# Patient Record
Sex: Female | Born: 1975 | Race: Black or African American | Hispanic: No | Marital: Single | Smoking: Former smoker
Health system: Southern US, Community
[De-identification: ages and names within clinical notes are randomized; demographics above are authoritative.]

## PROBLEM LIST (undated history)

## (undated) DIAGNOSIS — I10 Essential (primary) hypertension: Secondary | ICD-10-CM

## (undated) HISTORY — PX: HERNIA REPAIR: SHX51

## (undated) HISTORY — PX: DEBRIDEMENT AND CLOSURE WOUND: SHX5614

---

## 2016-08-09 ENCOUNTER — Emergency Department (HOSPITAL_COMMUNITY)
Admission: EM | Admit: 2016-08-09 | Discharge: 2016-08-09 | Disposition: A | Discharge status: Home / Self Care | Payer: Medicaid Other | Attending: Emergency Medicine | Admitting: Emergency Medicine

## 2016-08-09 ENCOUNTER — Emergency Department (HOSPITAL_COMMUNITY): Payer: Medicaid Other

## 2016-08-09 ENCOUNTER — Encounter (HOSPITAL_COMMUNITY): Payer: Self-pay

## 2016-08-09 DIAGNOSIS — J0120 Acute ethmoidal sinusitis, unspecified: Secondary | ICD-10-CM

## 2016-08-09 DIAGNOSIS — Z87891 Personal history of nicotine dependence: Secondary | ICD-10-CM | POA: Insufficient documentation

## 2016-08-09 DIAGNOSIS — I10 Essential (primary) hypertension: Secondary | ICD-10-CM

## 2016-08-09 DIAGNOSIS — R55 Syncope and collapse: Secondary | ICD-10-CM | POA: Diagnosis not present

## 2016-08-09 DIAGNOSIS — Z760 Encounter for issue of repeat prescription: Secondary | ICD-10-CM | POA: Insufficient documentation

## 2016-08-09 DIAGNOSIS — R202 Paresthesia of skin: Secondary | ICD-10-CM | POA: Insufficient documentation

## 2016-08-09 HISTORY — DX: Essential (primary) hypertension: I10

## 2016-08-09 LAB — URINALYSIS, ROUTINE W REFLEX MICROSCOPIC
Bilirubin Urine: NEGATIVE
GLUCOSE, UA: NEGATIVE mg/dL
KETONES UR: NEGATIVE mg/dL
Leukocytes, UA: NEGATIVE
Nitrite: NEGATIVE
PROTEIN: 100 mg/dL — AB
Specific Gravity, Urine: 1.012 (ref 1.005–1.030)
pH: 6 (ref 5.0–8.0)

## 2016-08-09 LAB — COMPREHENSIVE METABOLIC PANEL
ALBUMIN: 3.5 g/dL (ref 3.5–5.0)
ALT: 14 U/L (ref 14–54)
AST: 21 U/L (ref 15–41)
Alkaline Phosphatase: 59 U/L (ref 38–126)
Anion gap: 8 (ref 5–15)
BUN: 9 mg/dL (ref 6–20)
CHLORIDE: 101 mmol/L (ref 101–111)
CO2: 29 mmol/L (ref 22–32)
Calcium: 9.1 mg/dL (ref 8.9–10.3)
Creatinine, Ser: 0.9 mg/dL (ref 0.44–1.00)
GFR calc Af Amer: >60 mL/min (ref >60)
GLUCOSE: 96 mg/dL (ref 65–99)
POTASSIUM: 3.4 mmol/L — AB (ref 3.5–5.1)
SODIUM: 138 mmol/L (ref 135–145)
Total Bilirubin: 0.4 mg/dL (ref 0.3–1.2)
Total Protein: 6.6 g/dL (ref 6.5–8.1)

## 2016-08-09 LAB — CBC WITH DIFFERENTIAL/PLATELET
BASOS ABS: 0 10*3/uL (ref 0.0–0.1)
BASOS PCT: 0 %
EOS ABS: 0.1 10*3/uL (ref 0.0–0.7)
EOS PCT: 2 %
HCT: 39 % (ref 36.0–46.0)
Hemoglobin: 13.1 g/dL (ref 12.0–15.0)
Lymphocytes Relative: 35 %
Lymphs Abs: 2.8 10*3/uL (ref 0.7–4.0)
MCH: 27 pg (ref 26.0–34.0)
MCHC: 33.6 g/dL (ref 30.0–36.0)
MCV: 80.2 fL (ref 78.0–100.0)
MONO ABS: 0.5 10*3/uL (ref 0.1–1.0)
Monocytes Relative: 6 %
Neutro Abs: 4.7 10*3/uL (ref 1.7–7.7)
Neutrophils Relative %: 57 %
PLATELETS: 346 10*3/uL (ref 150–400)
RBC: 4.86 MIL/uL (ref 3.87–5.11)
RDW: 13.7 % (ref 11.5–15.5)
WBC: 8.1 10*3/uL (ref 4.0–10.5)

## 2016-08-09 LAB — PREGNANCY, URINE: Preg Test, Ur: NEGATIVE

## 2016-08-09 MED ORDER — DICYCLOMINE HCL 20 MG PO TABS: 20.0000 mg | ORAL_TABLET | Freq: Two times a day (BID) | ORAL | 0 refills | Status: AC | PRN

## 2016-08-09 MED ORDER — FLUTICASONE PROPIONATE 50 MCG/ACT NA SUSP: 2.0000 | Freq: Every day | NASAL | 0 refills | Status: AC

## 2016-08-09 MED ORDER — LISINOPRIL 10 MG PO TABS
10.0000 mg | ORAL_TABLET | Freq: Every day | ORAL | 0 refills | Status: AC
Start: 2016-08-09 — End: ?

## 2016-08-09 MED ORDER — DOCUSATE SODIUM 100 MG PO CAPS: 100.0000 mg | ORAL_CAPSULE | Freq: Two times a day (BID) | ORAL | 0 refills | Status: AC

## 2016-08-09 MED ORDER — LORATADINE 10 MG PO TABS: 10.0000 mg | ORAL_TABLET | Freq: Every day | ORAL | 0 refills | Status: AC

## 2016-08-09 NOTE — ED Notes (Signed)
Pt ambulated back to room from RR with steady gait.

## 2016-08-09 NOTE — ED Notes (Signed)
Pt offered sandwich at DC and offered wheelchair out to waiting room.

## 2016-08-09 NOTE — ED Notes (Signed)
Pt returned to room and placed back on monitor.  

## 2016-08-09 NOTE — ED Provider Notes (Signed)
[facility]-EMERGENCY DEPT Provider Note   CSN: [id number] Arrival date & time: 08/09/16  1821     History   Chief Complaint Chief Complaint  Patient presents with  . Blurred Vision  . Medication Refill  . Hypertension    HPI Betty Fry is a 41 y.o. female.  HPI Patient states she ran out of her blood pressure medication 2 days ago. She's been having facial pain with nasal congestion for last few days. Also states she's had some blurred vision. She became lightheaded and felt that she was going to pass out earlier today. This prompted her 911 call. She complains of tingling sensation to the left upper and lower extremities. Denies chest pain or shortness of breath. No abdominal pain, nausea or vomiting. Past Medical History:  Diagnosis Date  . Hypertension     There are no active problems to display for this patient.   Past Surgical History:  Procedure Laterality Date  . CESAREAN SECTION    . DEBRIDEMENT AND CLOSURE WOUND    . HERNIA REPAIR     mid abdominal    OB History    No data available       Home Medications    Prior to Admission medications   Medication Sig Start Date End Date Taking? Authorizing Provider  fluticasone (FLONASE) 50 MCG/ACT nasal spray Place 2 sprays into both nostrils daily. 08/09/16   Loren Racer, MD  lisinopril (PRINIVIL,ZESTRIL) 10 MG tablet Take 1 tablet (10 mg total) by mouth daily. 08/09/16   Loren Racer, MD  loratadine (CLARITIN) 10 MG tablet Take 1 tablet (10 mg total) by mouth daily. 08/09/16   Loren Racer, MD    Family History No family history on file.  Social History Social History  Substance Use Topics  . Smoking status: Former Smoker    Types: Cigarettes  . Smokeless tobacco: Never Used     Comment: have not smoked in 4 days  . Alcohol use Yes     Comment: social     Allergies   Patient has no allergy information on record.   Review of Systems Review of Systems  Constitutional: Negative for chills and  fever.  HENT: Positive for congestion, sinus pain and sinus pressure. Negative for facial swelling.   Eyes: Positive for visual disturbance. Negative for pain.  Respiratory: Negative for cough and shortness of breath.   Cardiovascular: Negative for chest pain, palpitations and leg swelling.  Gastrointestinal: Negative for diarrhea, nausea and vomiting.  Genitourinary: Negative for dysuria, flank pain, frequency and hematuria.  Musculoskeletal: Negative for back pain and neck pain.  Skin: Negative for rash and wound.  Neurological: Positive for dizziness, light-headedness, numbness and headaches. Negative for syncope and weakness.  All other systems reviewed and are negative.    Physical Exam Updated Vital Signs BP (!) 147/90   Pulse 77   Temp 98.1 F (36.7 C) (Oral)   Resp 17   Ht 5\' 7"  (1.702 m)   Wt 233 lb (105.7 kg)   LMP 07/24/2016 (Exact Date)   SpO2 100%   BMI 36.49 kg/m   Physical Exam  Constitutional: She is oriented to person, place, and time. She appears well-developed and well-nourished.  HENT:  Head: Normocephalic and atraumatic.  Mouth/Throat: Oropharynx is clear and moist.  Left greater than right nasal mucosal edema. Patient has tenderness to percussion over the left frontal and maxillary sinuses.  Eyes: EOM are normal. Pupils are equal, round, and reactive to light.  Neck: Normal range of  motion. Neck supple.  No meningismus. No cervical lymphadenopathy.  Cardiovascular: Normal rate and regular rhythm.  Exam reveals no gallop and no friction rub.   No murmur heard. Pulmonary/Chest: Effort normal and breath sounds normal. No respiratory distress. She has no wheezes. She has no rales. She exhibits no tenderness.  Abdominal: Soft. Bowel sounds are normal. There is no tenderness. There is no rebound and no guarding.  Musculoskeletal: Normal range of motion. She exhibits no edema or tenderness.  No lower extremity swelling or asymmetry. 2+ distal pulses in all  extremities.  Neurological: She is alert and oriented to person, place, and time.  5/5 motor in all extremities. Mild decreased sensation to the left upper extremity compared to the right. No facial asymmetry.  Skin: Skin is warm and dry. Capillary refill takes less than 2 seconds. No rash noted. No erythema.  Psychiatric: She has a normal mood and affect. Her behavior is normal.  Nursing note and vitals reviewed.    ED Treatments / Results  Labs (all labs ordered are listed, but only abnormal results are displayed) Labs Reviewed  COMPREHENSIVE METABOLIC PANEL - Abnormal; Notable for the following:       Result Value   Potassium 3.4 (*)    All other components within normal limits  URINALYSIS, ROUTINE W REFLEX MICROSCOPIC - Abnormal; Notable for the following:    APPearance HAZY (*)    Hgb urine dipstick LARGE (*)    Protein, ur 100 (*)    Bacteria, UA RARE (*)    Squamous Epithelial / LPF 0-5 (*)    All other components within normal limits  CBC WITH DIFFERENTIAL/PLATELET  PREGNANCY, URINE    EKG  EKG Interpretation  Date/Time:  Thursday August 09 2016 18:55:32 EDT Ventricular Rate:  70 PR Interval:    QRS Duration: 92 QT Interval:  447 QTC Calculation: 483 R Axis:   -14 Text Interpretation:  Sinus rhythm Probable left atrial enlargement Probable anterior infarct, age indeterminate Abnormal T, consider ischemia, diffuse leads Lateral leads are also involved Confirmed by Ranae PalmsYELVERTON  MD, Yina Riviere ([id number]) on 08/09/2016 9:37:49 PM       Radiology Ct Head Wo Contrast  Result Date: 08/09/2016 CLINICAL DATA:  Headache and hypertension EXAM: CT HEAD WITHOUT CONTRAST TECHNIQUE: Contiguous axial images were obtained from the base of the skull through the vertex without intravenous contrast. COMPARISON:  None. FINDINGS: Brain: The ventricles are normal in size and configuration. There is a cavum septum pellucidum, an anatomic variant. There is no intracranial mass hemorrhage,  extra-axial fluid collection, or midline shift. Gray-white compartments appear normal. No acute infarct evident. Vascular: No hyperdense vessel. There is no appreciable vascular calcification. Skull: Bony calvarium appears intact. There is a deep scout focus of decreased attenuation which abuts the muscles overlying the right occipital bone measuring 1.0 x 0.6 cm, best appreciated on axial slice 6 series 201. Sinuses/Orbits: There is slight mucosal thickening in the anterior inferior ethmoid air cells bilaterally. Other visualized paranasal sinuses are clear. Visualized orbits appear symmetric bilaterally. Other: Mastoid air cells are clear. IMPRESSION: No intracranial mass, hemorrhage, or extra-axial fluid collection. Gray-white compartments are normal. Probable deep sebaceous cyst, right occipital region. Slight ethmoid sinus disease bilaterally. Electronically Signed   By: Bretta BangWilliam  Woodruff III M.D.   On: 08/09/2016 19:58    Procedures Procedures (including critical care time)  Medications Ordered in ED Medications - No data to display   Initial Impression / Assessment and Plan / ED Course  I have  reviewed the triage vital signs and the nursing notes.  Pertinent labs & imaging results that were available during my care of the patient were reviewed by me and considered in my medical decision making (see chart for details).     Patient with similar presentation to [facility] emergency department 2 months ago. Normal workup at that time. Likely has sinusitis versus hypertension induced headache. No suspicion for intracranial bleed or stroke. Patient has abnormal EKG. She denies any chest pain. Review of records from [facility]. Patient had EKG in January of this year that demonstrated septal Q waves and T-wave abnormalities and inferior lateral leads. This is consistent with her current EKG. Blood pressure has improved. Her paresthesias are improved as well. She is resting comfortably. CT  doesn't demonstrate some ethmoidal wall thickening consistent with sinusitis. We'll start on Claritin and Flonase. Have refilled her cerebral but she's been instructed to follow up closely with her primary physician. Return precautions given. Final Clinical Impressions(s) / ED Diagnoses   Final diagnoses:  Acute ethmoidal sinusitis, recurrence not specified  Hypertension, unspecified type  Medication refill  Paresthesias  Near syncope    New Prescriptions New Prescriptions   FLUTICASONE (FLONASE) 50 MCG/ACT NASAL SPRAY    Place 2 sprays into both nostrils daily.   LISINOPRIL (PRINIVIL,ZESTRIL) 10 MG TABLET    Take 1 tablet (10 mg total) by mouth daily.   LORATADINE (CLARITIN) 10 MG TABLET    Take 1 tablet (10 mg total) by mouth daily.     Loren Racer, MD 08/09/16 2140

## 2016-08-09 NOTE — ED Notes (Signed)
Patient transported to CT 

## 2016-08-09 NOTE — ED Triage Notes (Signed)
Pt from [facility] brought in by [facility]. Pt called r/t blurred vision, left sided tingling, and being out of BP medications X2 days. EMS got manual BP of 196/106. 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 Pt reported to EMS that blurred vision started today at 1500. Pt ambulated to truck for EMS. Pt denies pain/tingling in left face only reporting it in arms and legs. No medication given PTA.

## 2016-08-09 NOTE — ED Notes (Signed)
ED Provider at bedside. 

## 2018-07-05 IMAGING — CT CT HEAD W/O CM
3 of 4 series · 17 of 47 positions shown, 20 images · non-contrast
Comparison: None.

CLINICAL DATA: Headache and hypertension

EXAM:
CT HEAD WITHOUT CONTRAST
TECHNIQUE: Contiguous axial images were obtained from the base of the skull
through the vertex without intravenous contrast.

[Series 201: head w/o, idose (1) · axial · non-contrast · 0.43mm/px · z∈[+1131,+1266]mm · 11 of 33 slices shown, 14 images]
[im 3/33  brain]
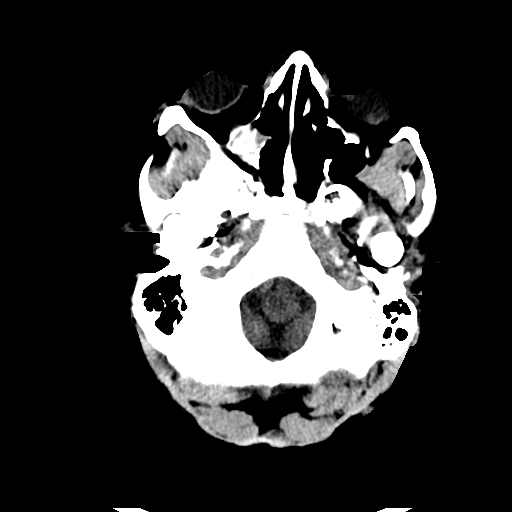
[im 3/33  bone]
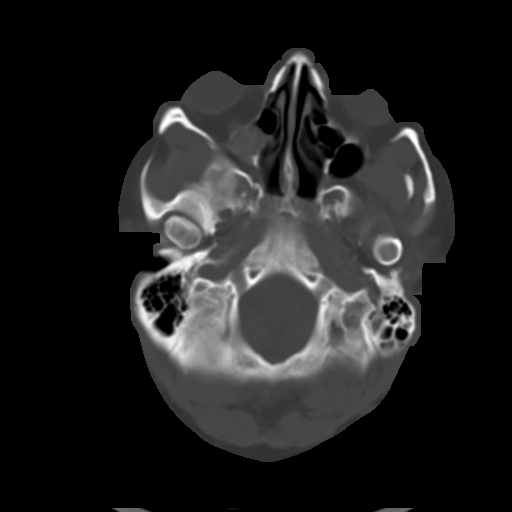
[im 5/33  brain]
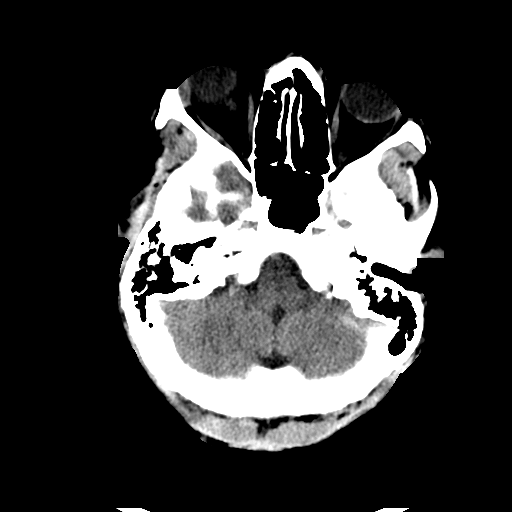
[im 7/33  brain]
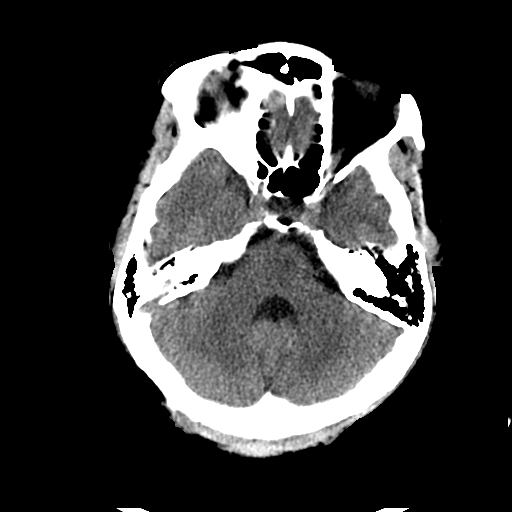
[im 12/33  brain]
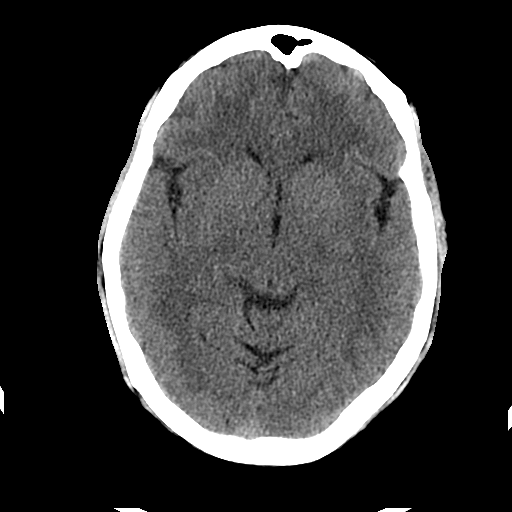
[im 14/33  brain]
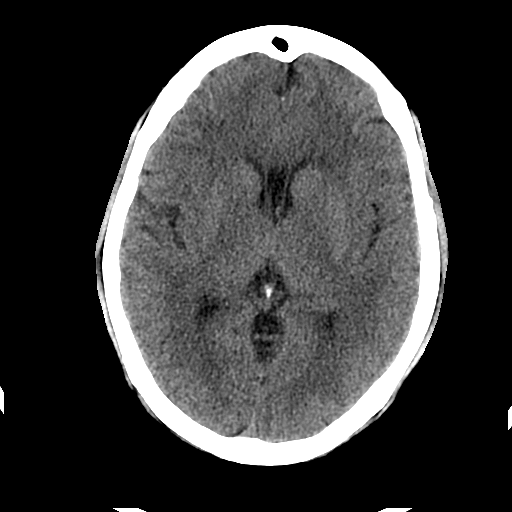
[im 14/33  bone]
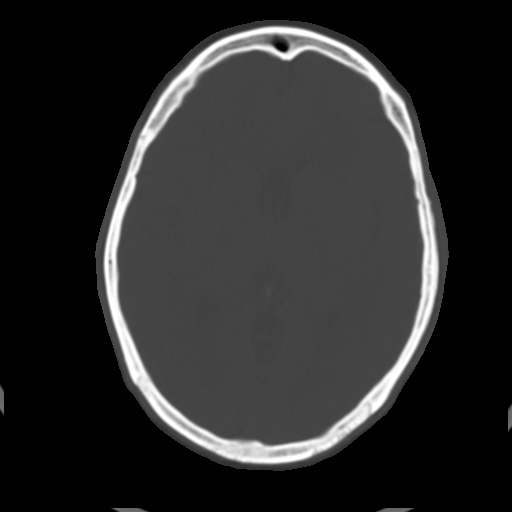
[im 17/33  brain]
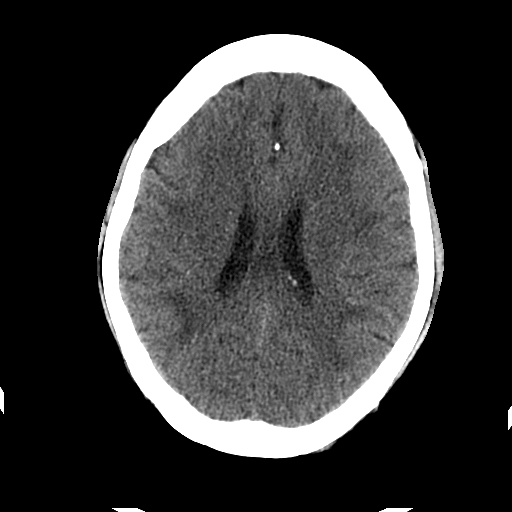
[im 19/33  brain]
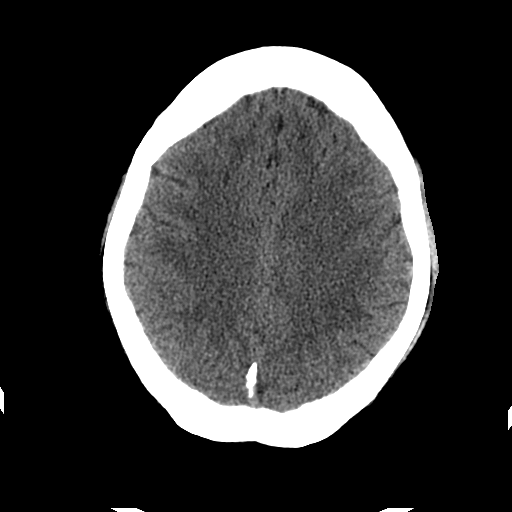
[im 21/33  brain]
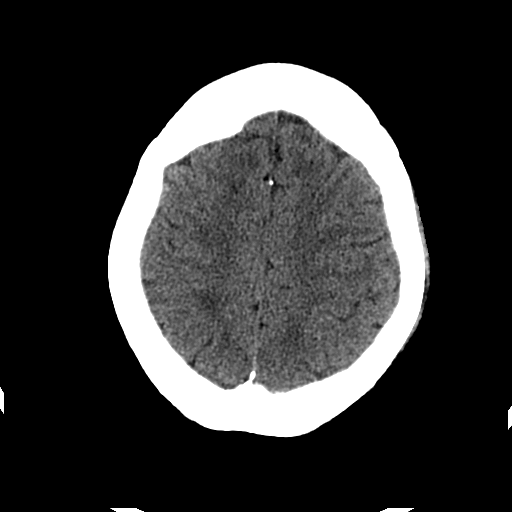
[im 26/33  brain]
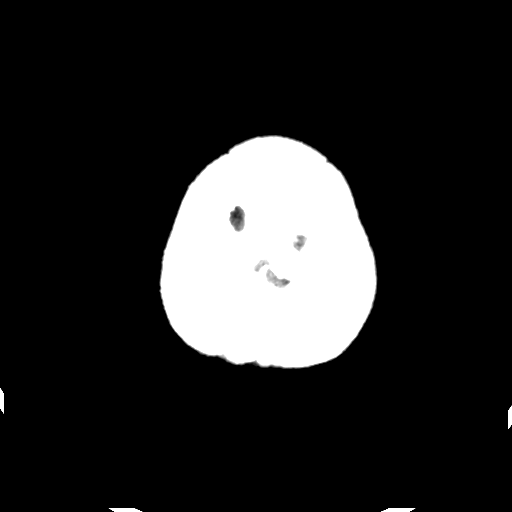
[im 26/33  bone]
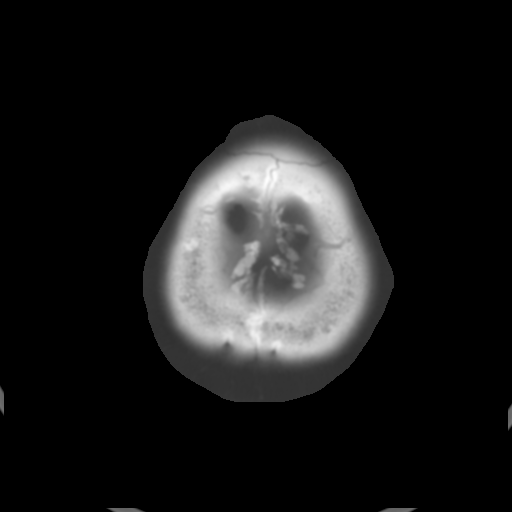
[im 28/33  brain]
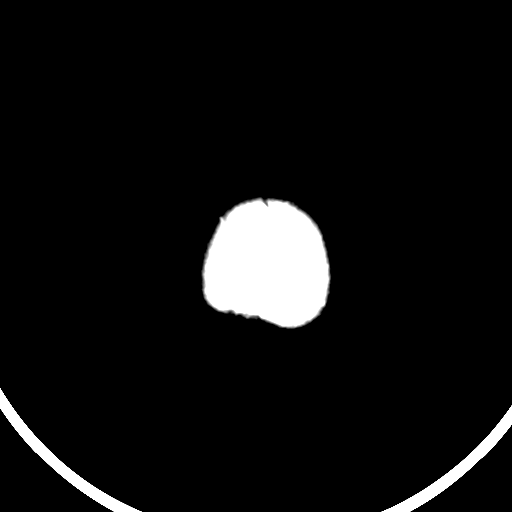
[im 30/33  brain]
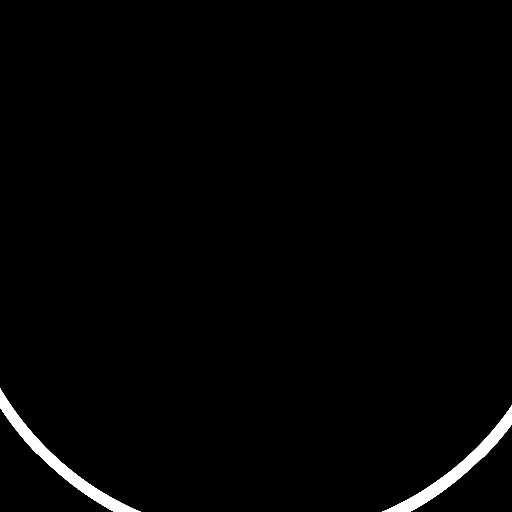

[Series 203: coronal st, idose (1) · coronal · 0.40mm/px · 3 of 72 slices shown]
[im 24/72  brain]
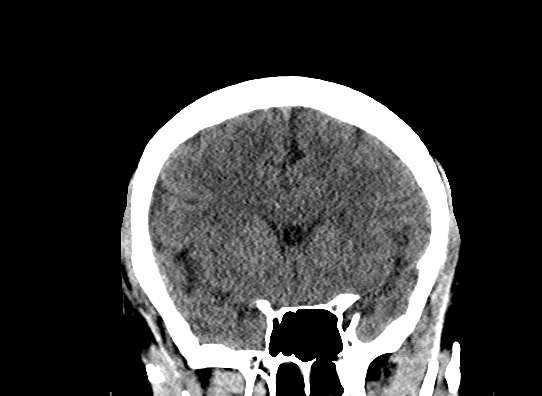
[im 32/72  brain]
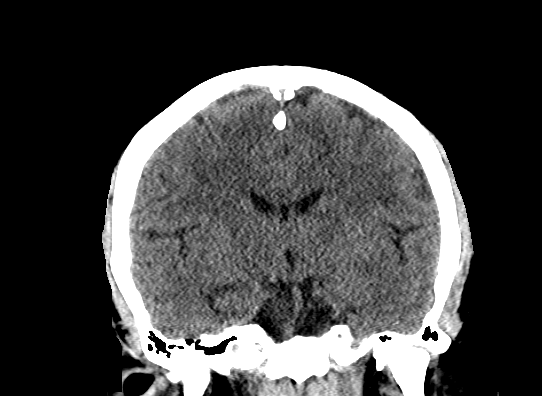
[im 40/72  brain]
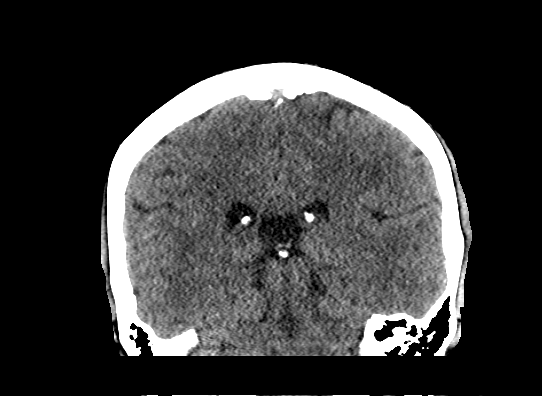

[Series 204: sagittal st, idose (1) · sagittal · 0.40mm/px · 3 of 72 slices shown]
[im 24/72  brain]
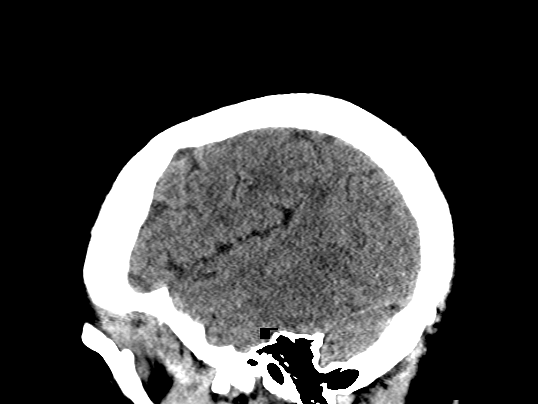
[im 36/72  brain]
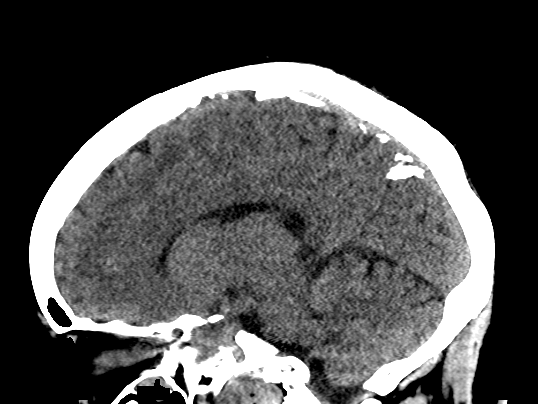
[im 48/72  brain]
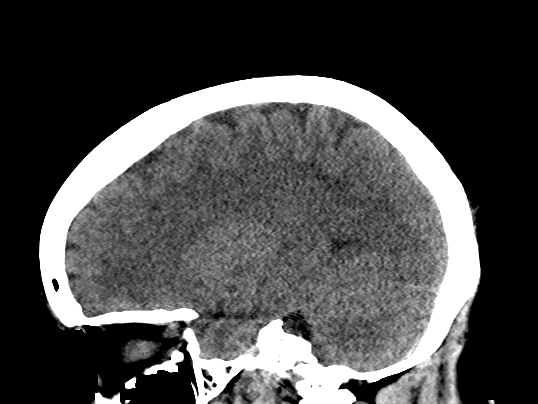

[17 of 47 positions shown; findings below may reference images not displayed]

FINDINGS: Brain: The ventricles are normal in size and configuration. There is
a cavum septum pellucidum, an anatomic variant. There is no
intracranial mass hemorrhage, extra-axial fluid collection, or
midline shift. Gray-white compartments appear normal. No acute
infarct evident.

Vascular: No hyperdense vessel. There is no appreciable vascular
calcification.

Skull: Bony calvarium appears intact. There is a deep scout focus of
decreased attenuation which abuts the muscles overlying the right
occipital bone measuring 1.0 x 0.6 cm, best appreciated on axial
slice 6 series 201.

Sinuses/Orbits: There is slight mucosal thickening in the anterior
inferior ethmoid air cells bilaterally. Other visualized paranasal
sinuses are clear. Visualized orbits appear symmetric bilaterally.

Other: Mastoid air cells are clear.
IMPRESSION: No intracranial mass, hemorrhage, or extra-axial fluid collection.
Gray-white compartments are normal.

Probable deep sebaceous cyst, right occipital region. Slight ethmoid
sinus disease bilaterally.
